# Patient Record
Sex: Female | Born: 1970 | Race: White | Hispanic: No | Smoking: Former smoker
Health system: Southern US, Community
[De-identification: ages and names within clinical notes are randomized; demographics above are authoritative.]

## PROBLEM LIST (undated history)

## (undated) DIAGNOSIS — F909 Attention-deficit hyperactivity disorder, unspecified type: Secondary | ICD-10-CM

---

## 2003-10-29 ENCOUNTER — Ambulatory Visit (HOSPITAL_COMMUNITY): Admission: RE | Admit: 2003-10-29 | Discharge: 2003-10-29 | Payer: Self-pay | Admitting: Obstetrics and Gynecology

## 2004-04-14 ENCOUNTER — Inpatient Hospital Stay (HOSPITAL_COMMUNITY): Admission: AD | Admit: 2004-04-14 | Discharge: 2004-04-16 | Payer: Self-pay | Admitting: Obstetrics and Gynecology

## 2004-05-25 ENCOUNTER — Other Ambulatory Visit: Admission: RE | Admit: 2004-05-25 | Discharge: 2004-05-25 | Payer: Self-pay | Admitting: Obstetrics and Gynecology

## 2005-06-11 ENCOUNTER — Other Ambulatory Visit: Admission: RE | Admit: 2005-06-11 | Discharge: 2005-06-11 | Payer: Self-pay | Admitting: Obstetrics and Gynecology

## 2008-12-26 ENCOUNTER — Emergency Department: Payer: Self-pay | Admitting: Internal Medicine

## 2009-12-27 IMAGING — CT CT HEAD WITHOUT CONTRAST
2 series · 16 of 30 positions shown, 20 images · non-contrast
Comparison: none

REASON FOR EXAM: loc contusion
COMMENTS:

PROCEDURE:     CT  - CT HEAD WITHOUT CONTRAST  - December 26, 2008  [DATE]
RESULT:     Comparison: None
TECHNIQUE: Multiple axial images from the foramen magnum to the vertex were
obtained without IV contrast.

[Series 2: without · axial · non-contrast · 0.42mm/px · z∈[+344,+474]mm · 13 of 32 slices shown, 17 images]
[im 3/32  brain]
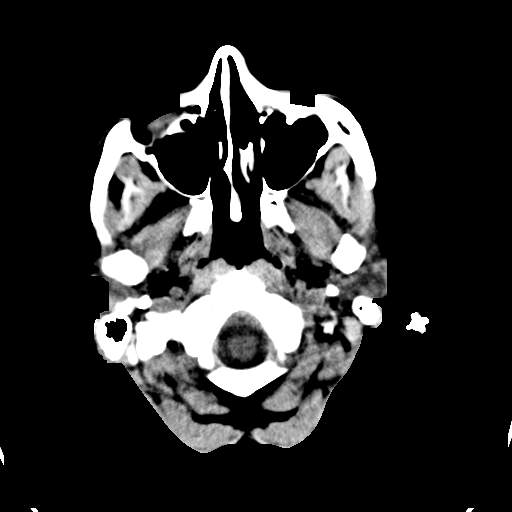
[im 3/32  bone]
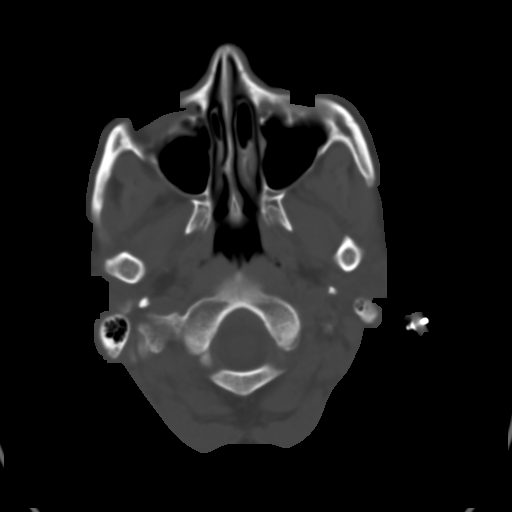
[im 5/32  brain]
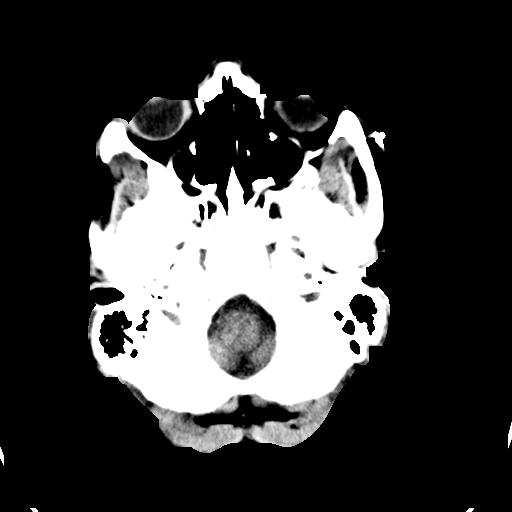
[im 7/32  brain]
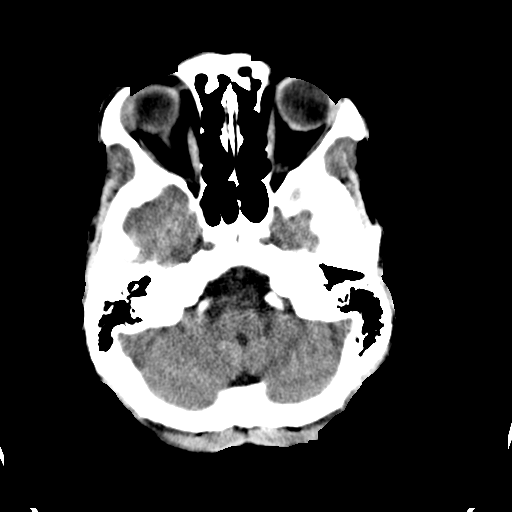
[im 9/32  brain]
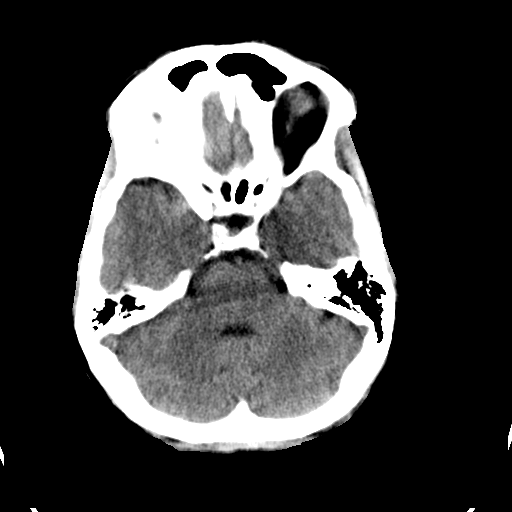
[im 12/32  brain]
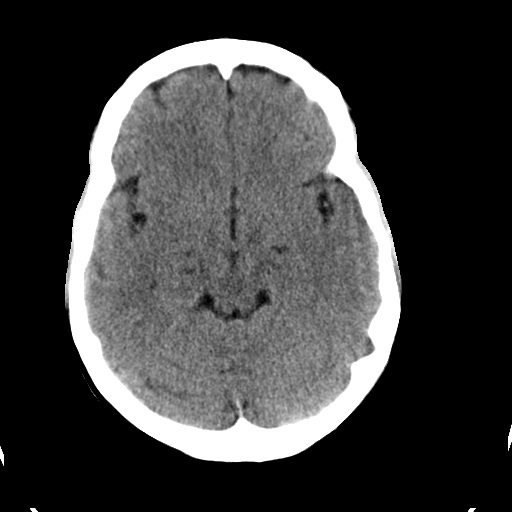
[im 12/32  bone]
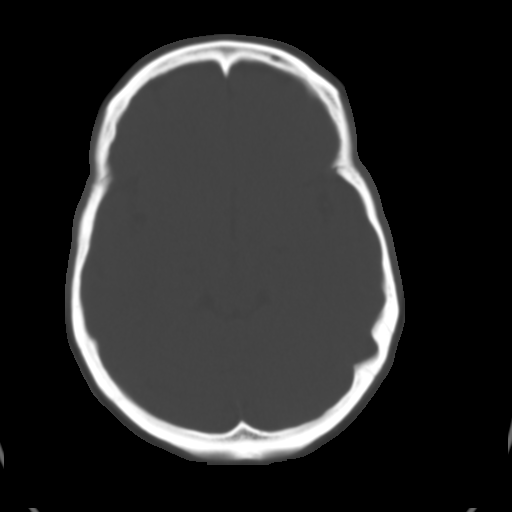
[im 14/32  brain]
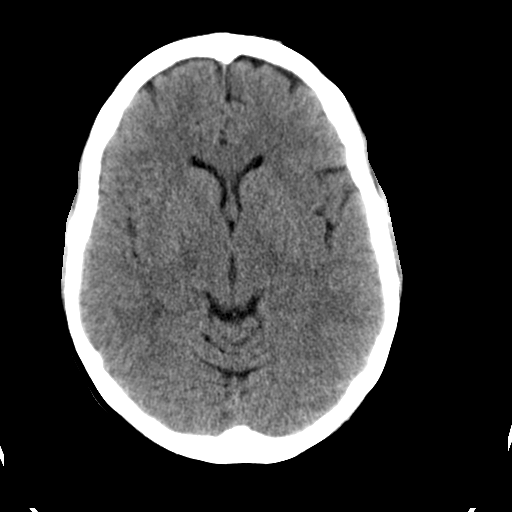
[im 16/32  brain]
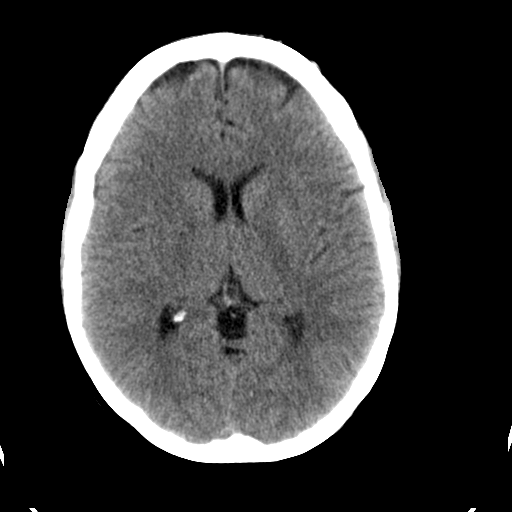
[im 18/32  brain]
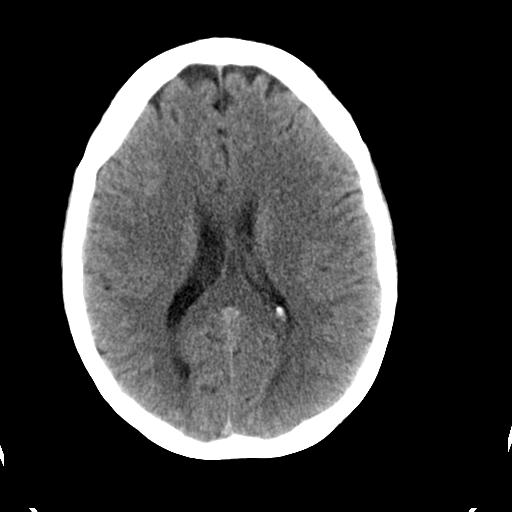
[im 20/32  brain]
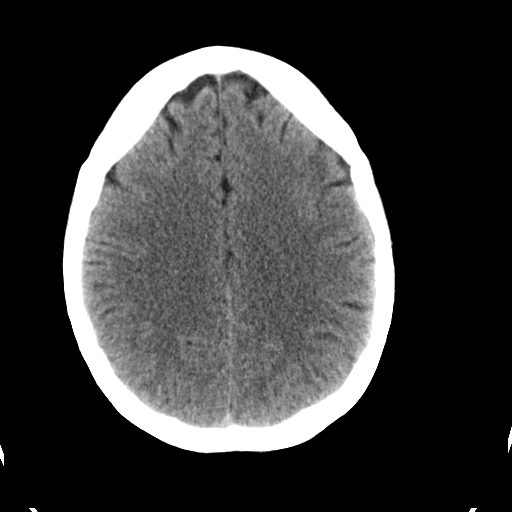
[im 20/32  bone]
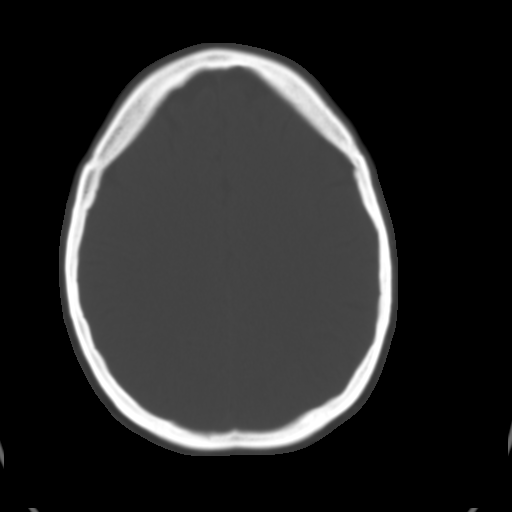
[im 23/32  brain]
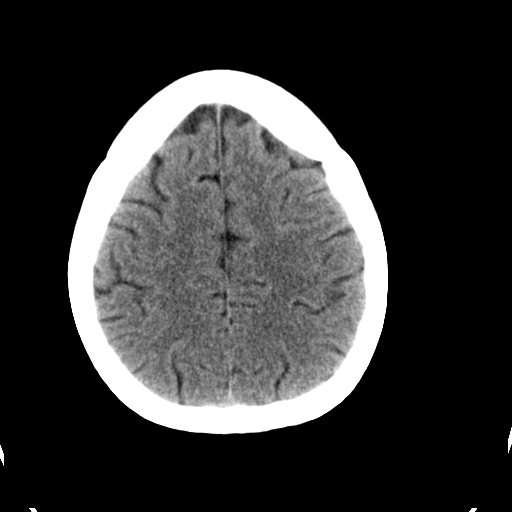
[im 25/32  brain]
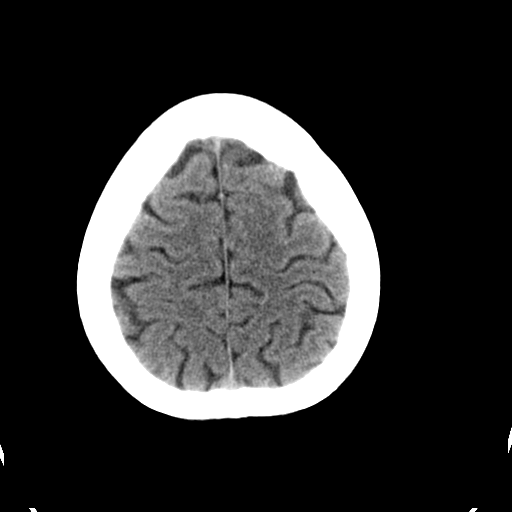
[im 27/32  brain]
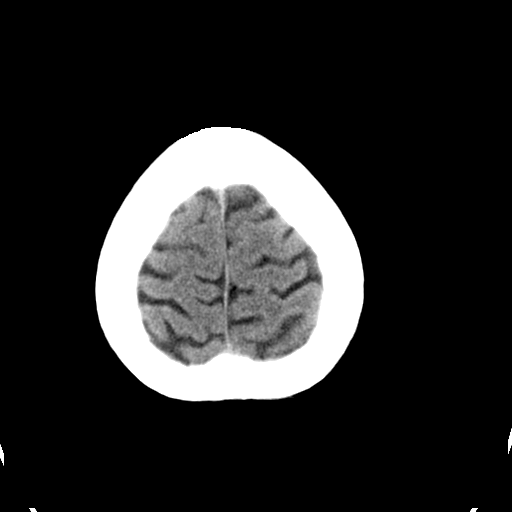
[im 29/32  brain]
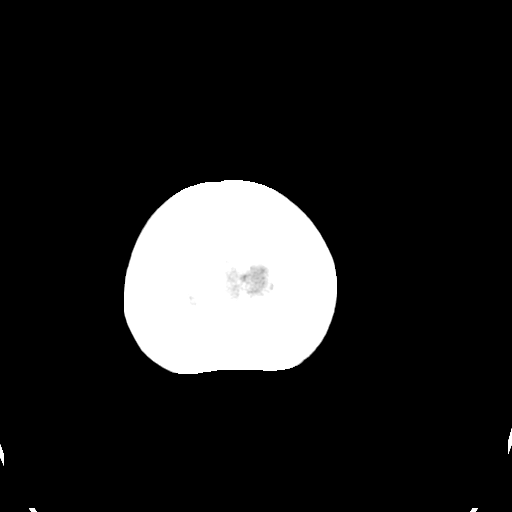
[im 29/32  bone]
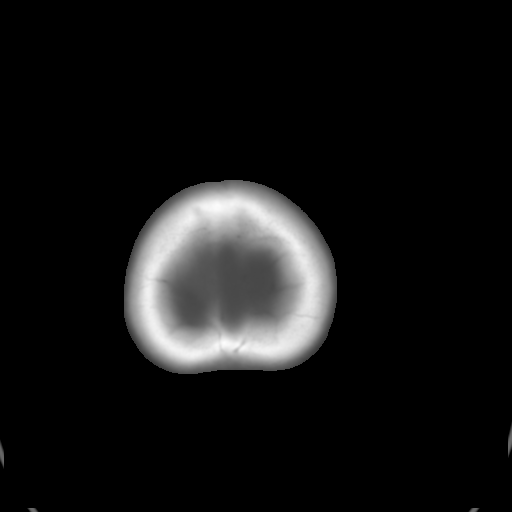

[Series 3: bone · axial · 0.42mm/px · z∈[+344,+388]mm · 3 of 32 slices shown]
[im 3/32  bone]
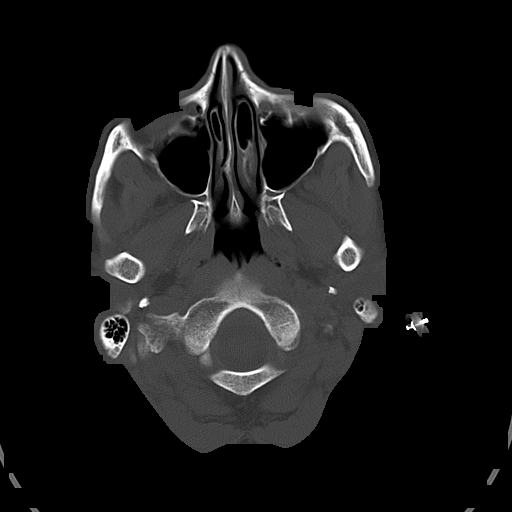
[im 7/32  bone]
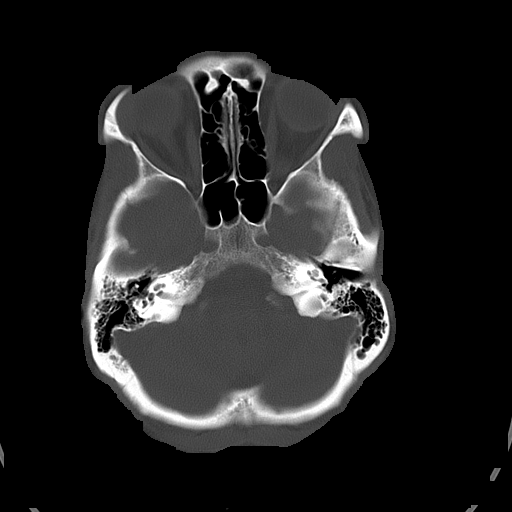
[im 12/32  bone]
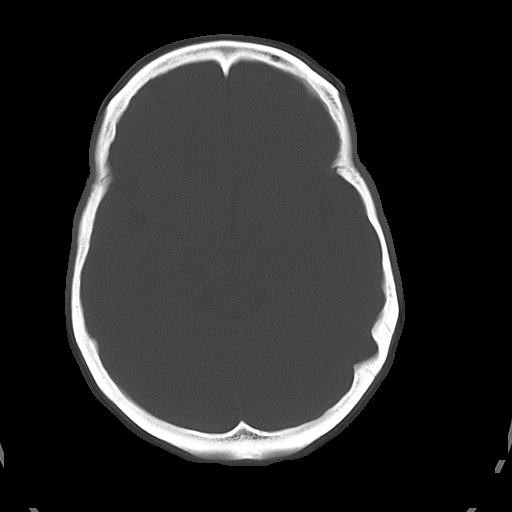

[16 of 30 positions shown; findings below may reference images not displayed]

FINDINGS: There is no evidence for mass effect, midline shift, or extra-axial fluid
collections.  There is no evidence for space-occupying lesion or
intracranial hemorrhage. There is no evidence for a cortical-based area of
acute infarction.

The ventricles and sulci are appropriate for the patient's age. The basal
cisterns are patent.

Visualized portions of the orbits are unremarkable. The paranasal sinuses
and mastoid air cells are unremarkable.

The osseous structures are unremarkable.
IMPRESSION: No acute intracranial process.

## 2010-04-10 ENCOUNTER — Ambulatory Visit: Payer: Self-pay | Admitting: Gastroenterology

## 2010-07-19 ENCOUNTER — Emergency Department: Payer: Self-pay | Admitting: Emergency Medicine

## 2010-07-26 ENCOUNTER — Emergency Department: Payer: Self-pay | Admitting: Emergency Medicine

## 2011-07-20 IMAGING — US TRANSABDOMINAL ULTRASOUND OF PELVIS
1 series · 17 of 25 positions shown · non-contrast
Comparison: none

REASON FOR EXAM: lower abd pain
COMMENTS:   LMP: Three weeks ago

PROCEDURE:     US  - US PELVIS EXAM  - July 19, 2010  [DATE]
RESULT:     Comparison: None
INDICATION: Lower abdominal pain.
TECHNIQUE: Multiple transabdominal gray-scale images  of the pelvis
performed. Patient refused endovaginal examination.

[Series 1: transabdominal ultrasound of pelvis · 17 of 35 slices shown]
[im 1/35]
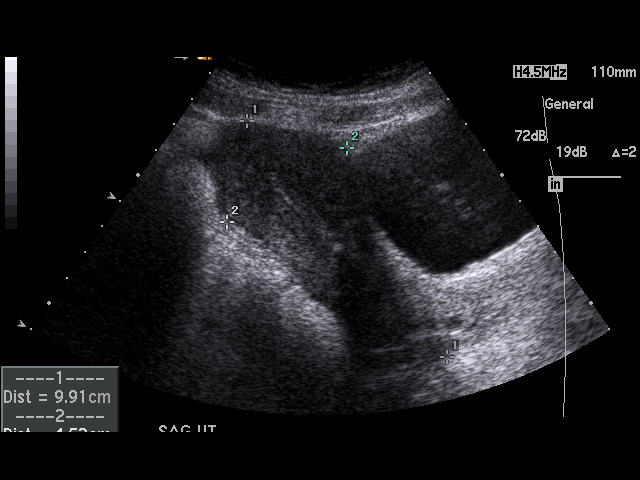
[im 3/35]
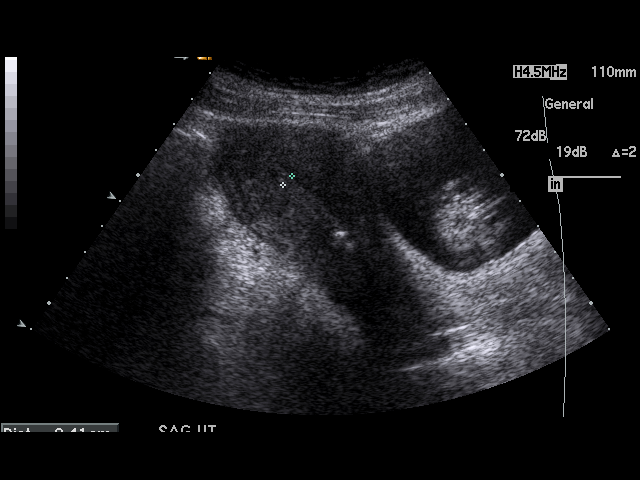
[im 5/35]
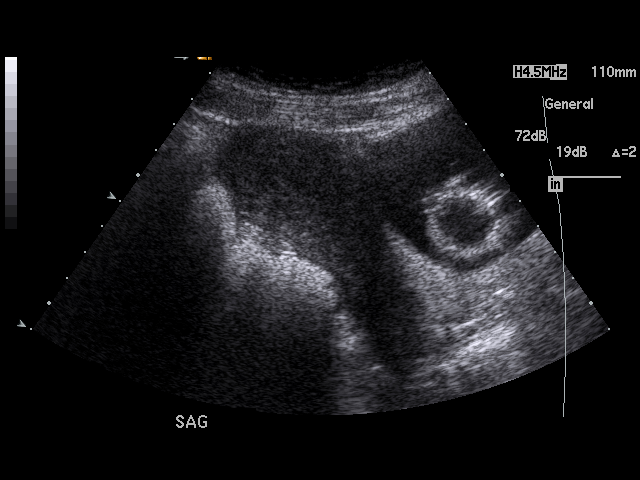
[im 8/35]
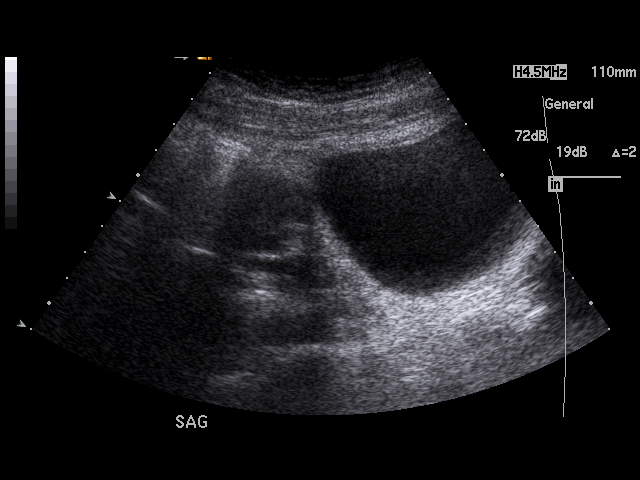
[im 9/35]
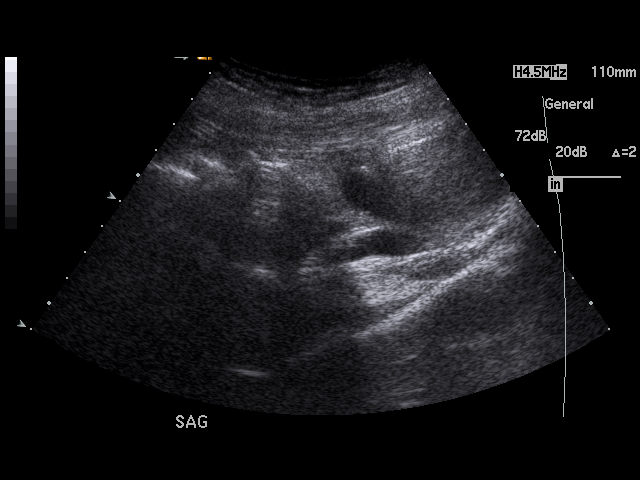
[im 12/35]
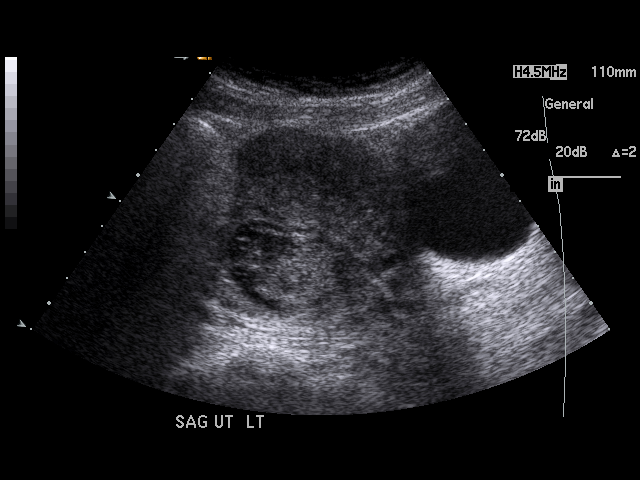
[im 13/35]
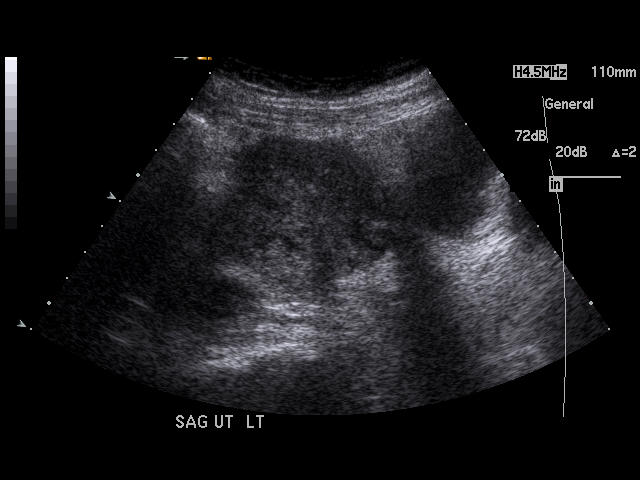
[im 16/35]
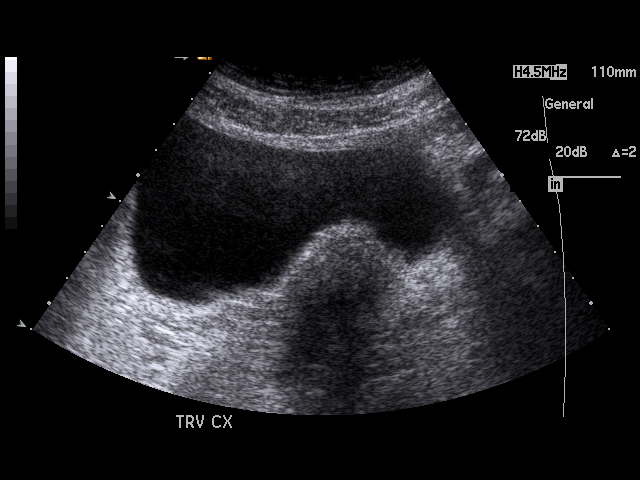
[im 18/35]
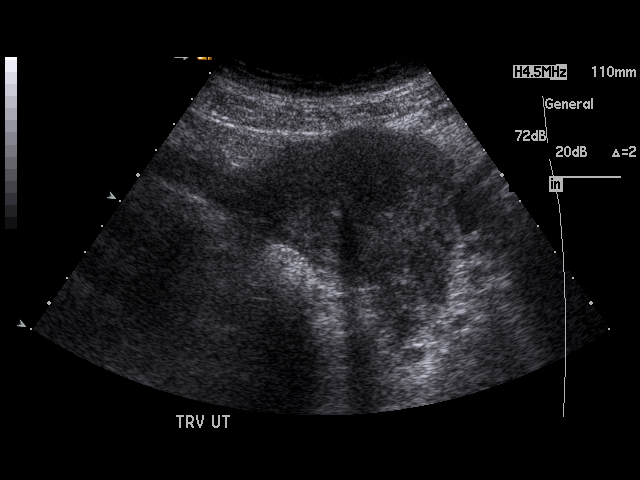
[im 19/35]
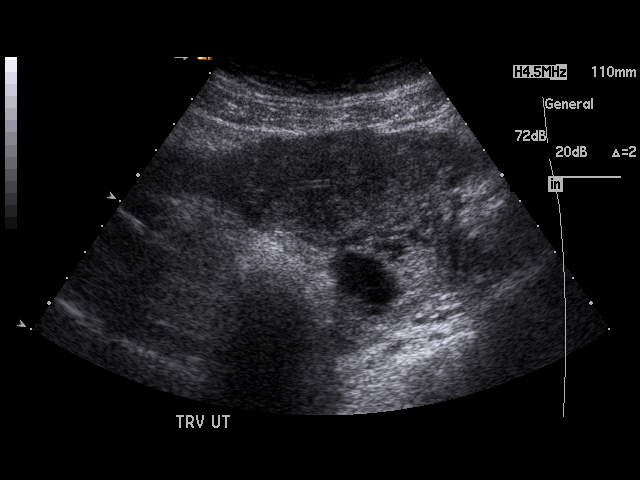
[im 22/35]
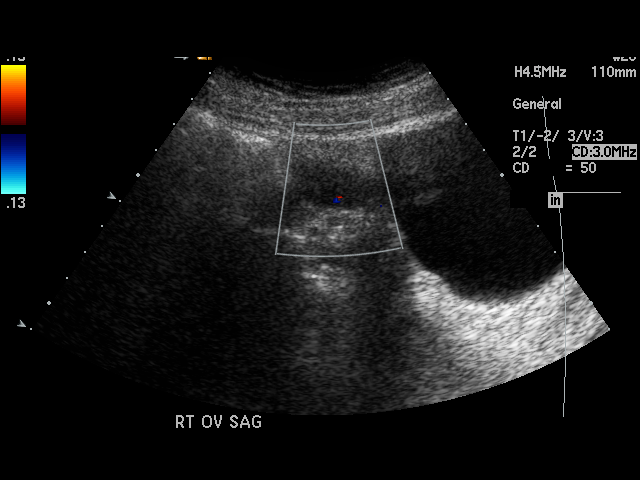
[im 23/35]
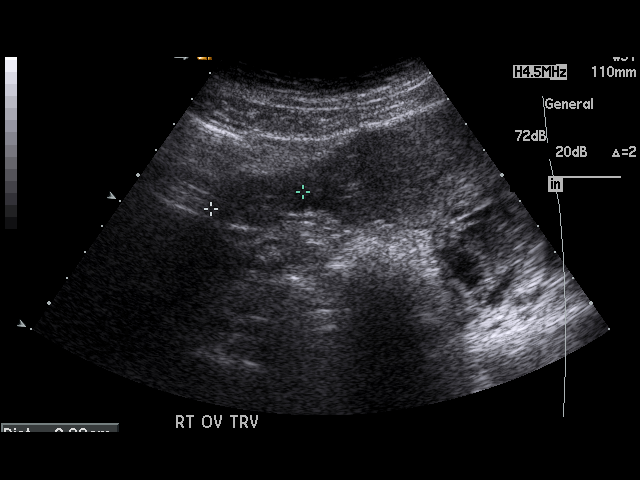
[im 26/35]
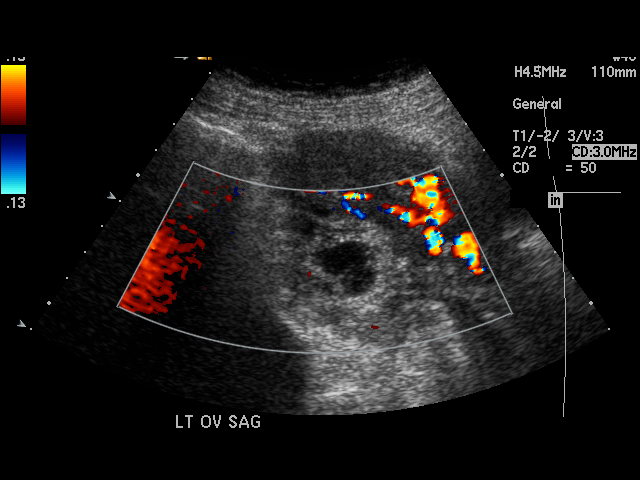
[im 27/35]
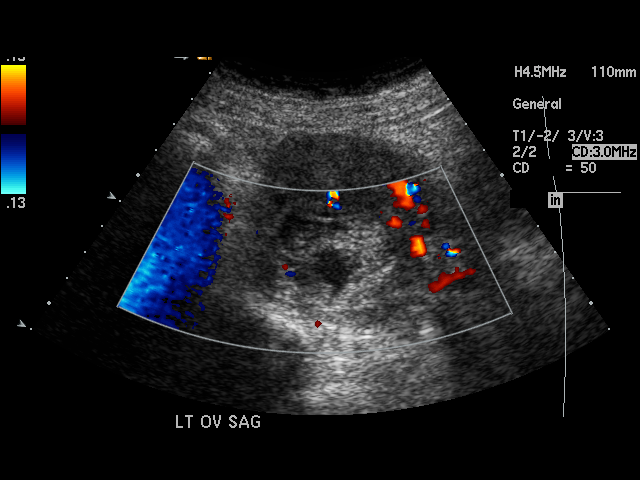
[im 30/35]
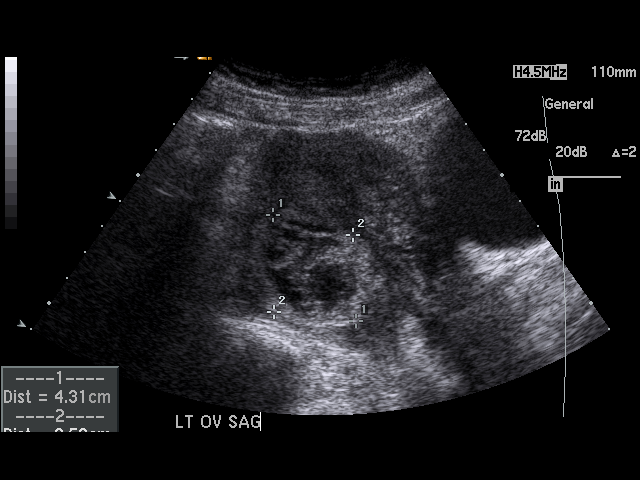
[im 32/35]
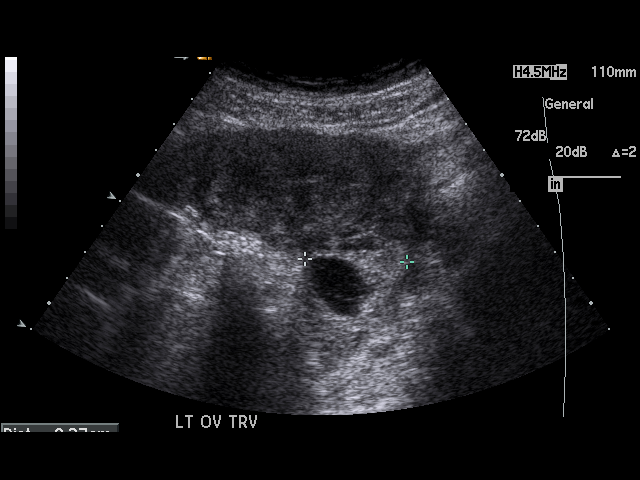
[im 35/35]
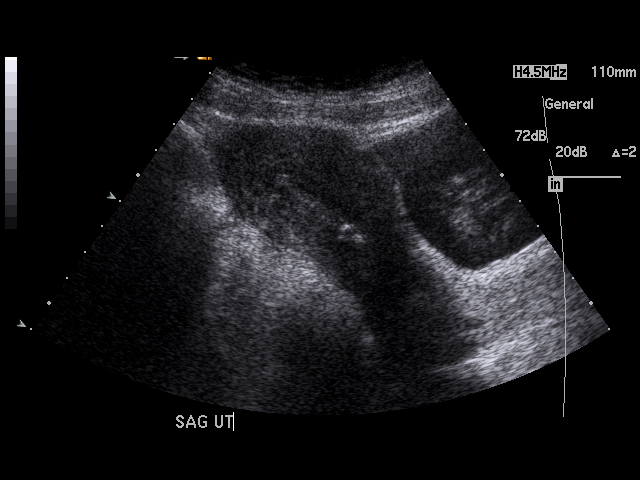

[17 of 25 positions shown; findings below may reference images not displayed]

FINDINGS: The uterus is normal in echotexture measuring 9.9 x 4.5 x 6.1 cm , with
transabdominal ultrasound. The endometrial stripe is uniform and homogeneous
measuring 4.1 mm.  There is an intrauterine device in place. There are no
abnormal solid or cystic myometrial mass lesions noted.

The right ovary measures 3.4 x 1.8 x 3 cm.  The left ovary measures 5.5 x
4.4 x 3.9 cm.  There is a 4.3 x 3.5 x 3.3 cm complex left ovarian mass with
multiple internal fixed septations and small cystic areas.

There is no pelvic free fluid.
IMPRESSION: There is a 4.3 x 3.5 x 3.3 cm complex left ovarian mass with multiple
internal fixed septations and small cystic areas.  This may represent a
hemorrhagic cyst versus endometrioma versus less likely malignancy.
Recommend followup pelvic ultrasound in 4-6 weeks.

## 2011-07-20 IMAGING — US ABDOMEN ULTRASOUND
1 series · 17 of 25 positions shown · non-contrast
Comparison: none

REASON FOR EXAM: abd pain for 3 days.
COMMENTS:   May transport without cardiac monitor

PROCEDURE:     US  - US ABDOMEN GENERAL SURVEY  - July 19, 2010  [DATE]
RESULT:     Comparison: None
TECHNIQUE: Multiple gray-scale and color-flow Doppler images of the abdomen
are presented for review.

[Series 1: abdomen ultrasound · 17 of 55 slices shown]
[im 1/55]
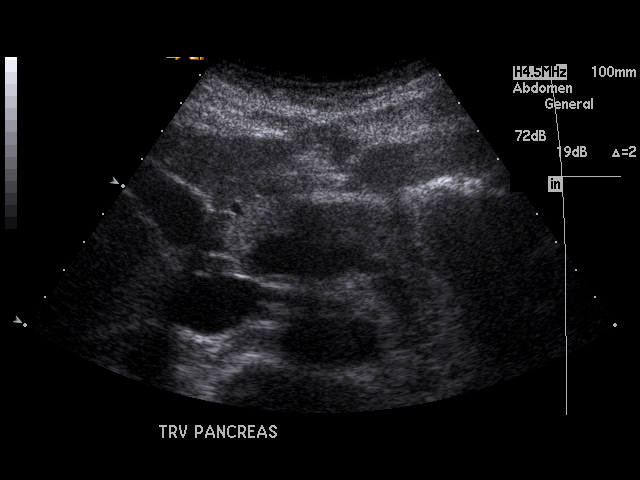
[im 5/55]
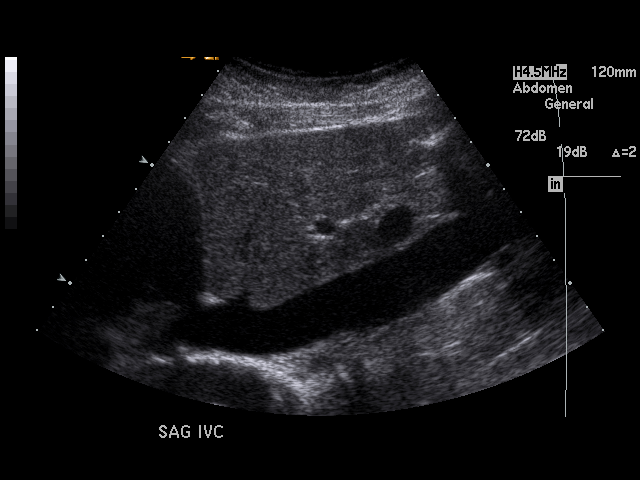
[im 7/55]
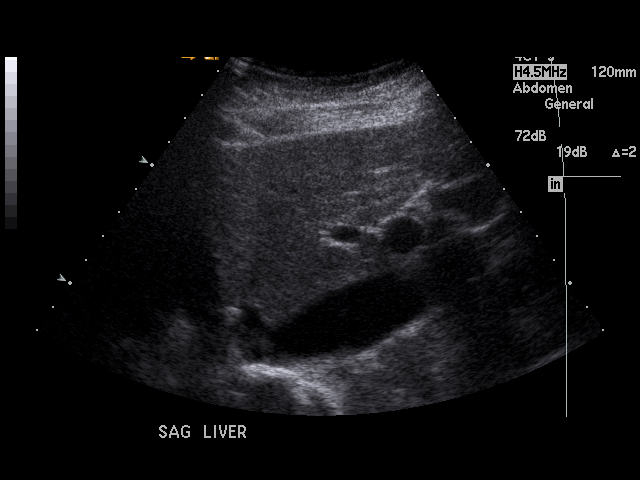
[im 12/55]
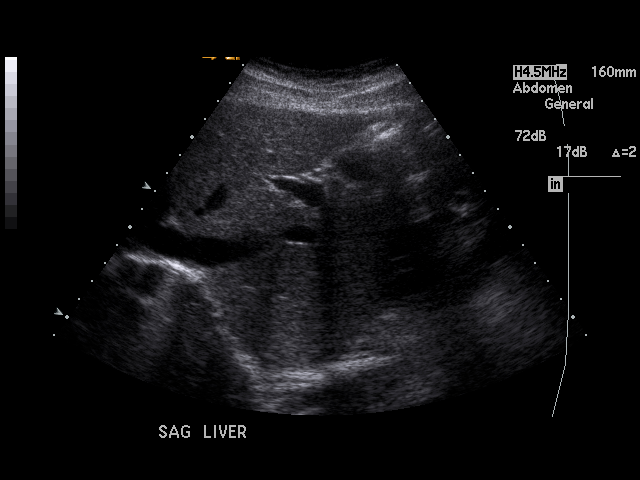
[im 14/55]
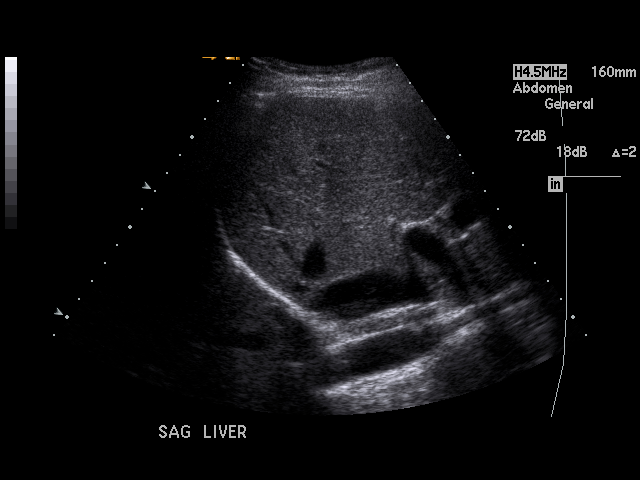
[im 19/55]
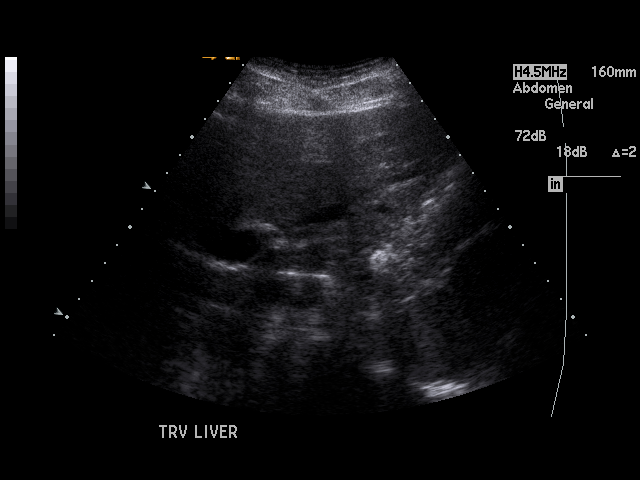
[im 21/55]
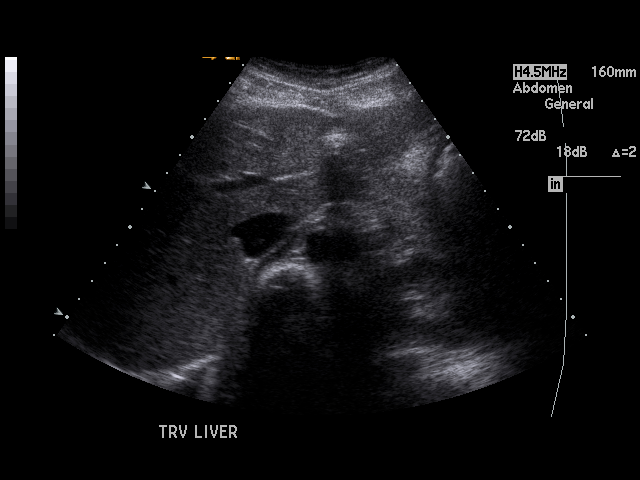
[im 25/55]
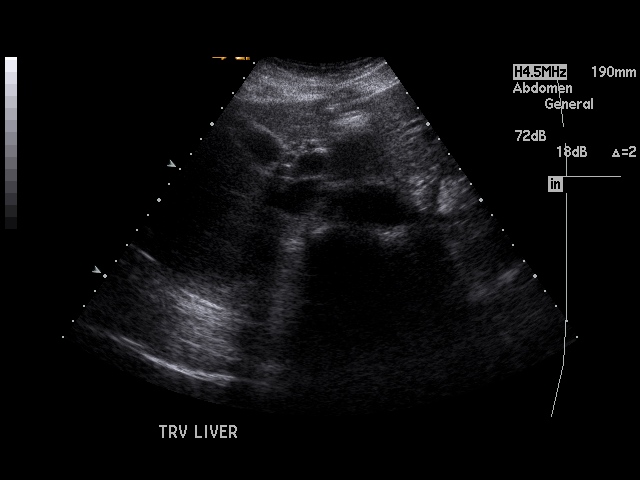
[im 28/55]
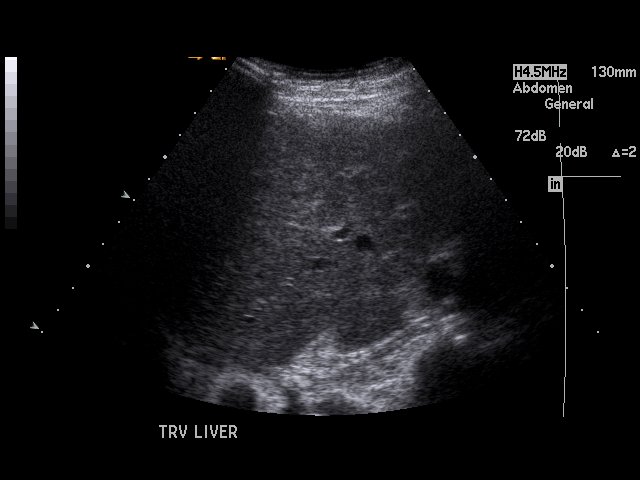
[im 30/55]
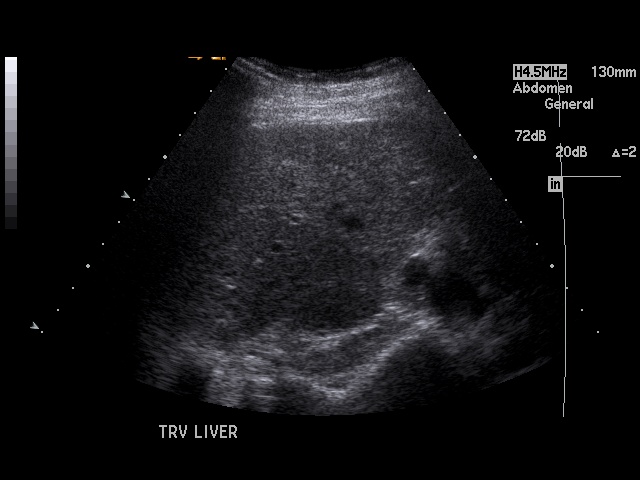
[im 34/55]
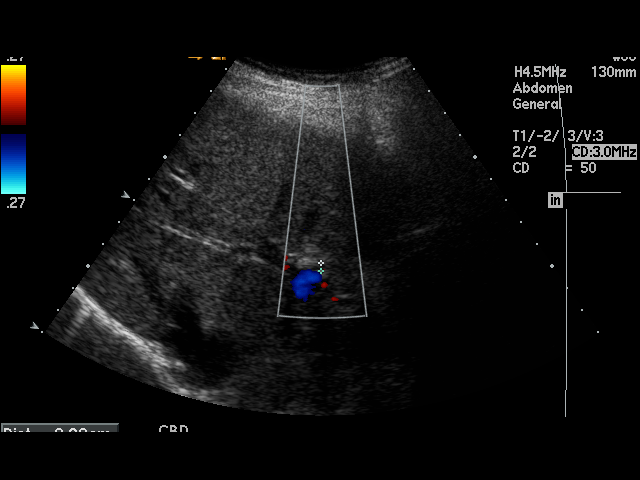
[im 37/55]
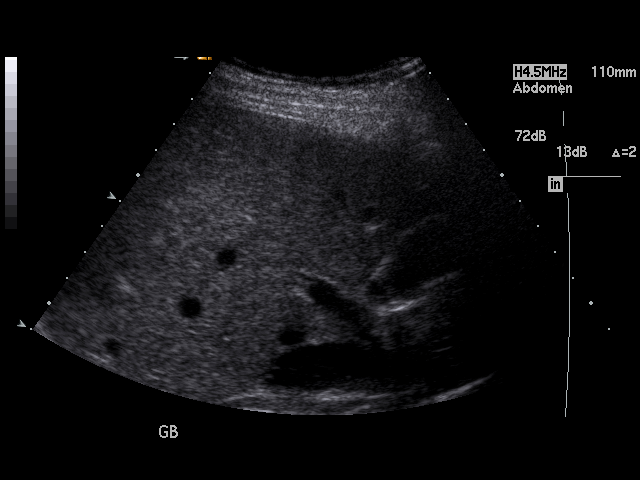
[im 41/55]
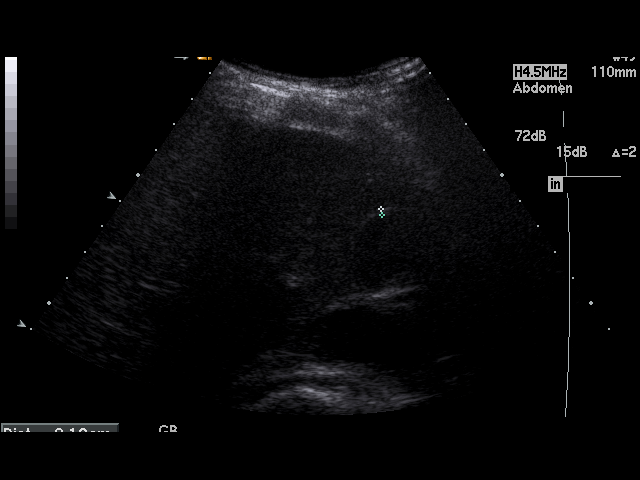
[im 43/55]
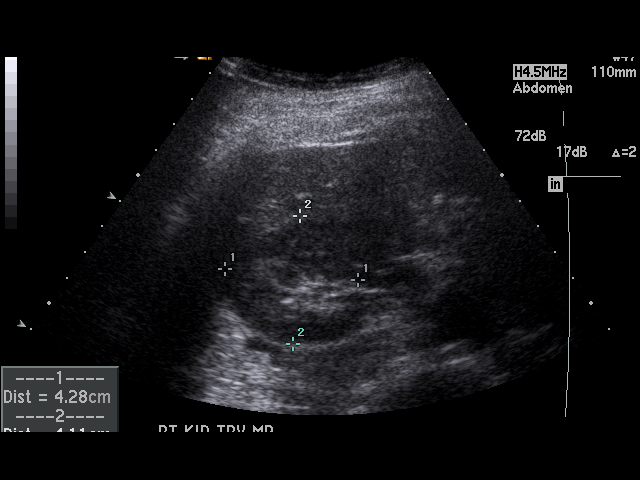
[im 48/55]
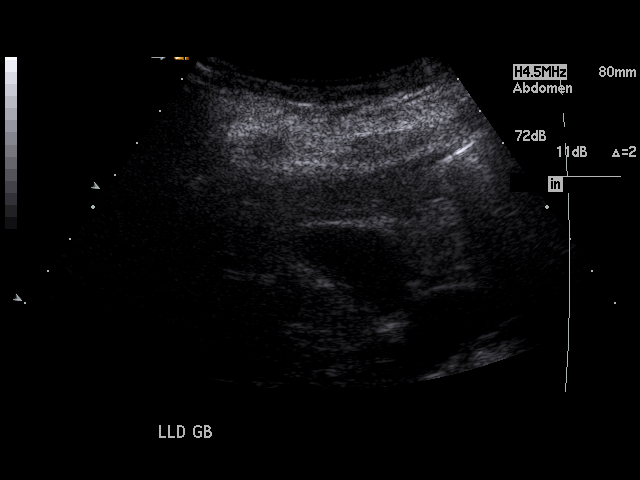
[im 50/55]
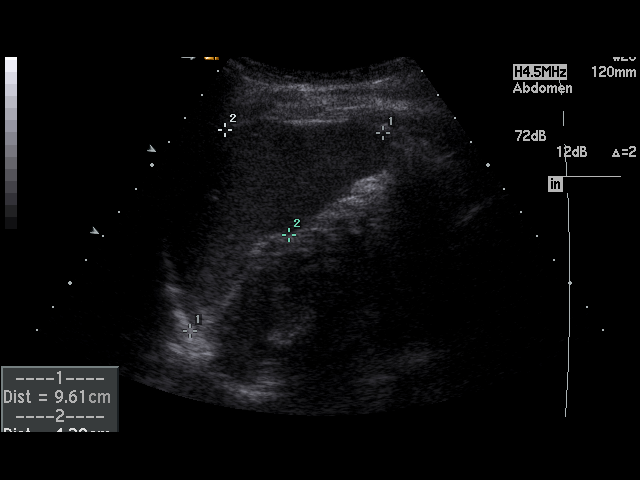
[im 55/55]
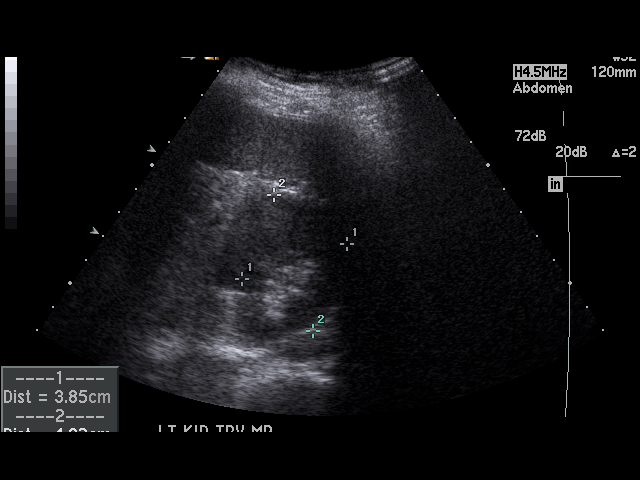

[17 of 25 positions shown; findings below may reference images not displayed]

FINDINGS: Visualized portions of the liver demonstrate normal echogenicity and normal
contours. The liver is without evidence of a focal hepatic lesion.

There is no cholelithiasis or biliary sludge. There is no intra- or
extrahepatic biliary ductal dilatation. The common duct measures 3 mm in
maximal diameter. There is no gallbladder wall thickening, pericholecystic
fluid, or sonographic Murphy's sign.

The visualized portion of the pancreas is normal in echogenicity. The spleen
is unremarkable. Bilateral kidneys are normal in echogenicity and size. The
right kidney measures 10.4 x 4.1 x 4.3 cm. The left kidney measures 10.5 x
4.1 x 3.9 cm. There are no renal calculi or hydronephrosis. The abdominal
aorta and IVC are unremarkable.
IMPRESSION: Normal abdominal ultrasound.

## 2011-11-28 ENCOUNTER — Other Ambulatory Visit: Payer: Self-pay | Admitting: Obstetrics and Gynecology

## 2011-11-28 DIAGNOSIS — R928 Other abnormal and inconclusive findings on diagnostic imaging of breast: Secondary | ICD-10-CM

## 2011-12-03 ENCOUNTER — Ambulatory Visit
Admission: RE | Admit: 2011-12-03 | Discharge: 2011-12-03 | Disposition: A | Discharge status: Home / Self Care | Payer: Self-pay | Source: Ambulatory Visit | Attending: Obstetrics and Gynecology | Admitting: Obstetrics and Gynecology

## 2011-12-03 DIAGNOSIS — R928 Other abnormal and inconclusive findings on diagnostic imaging of breast: Secondary | ICD-10-CM

## 2019-11-13 ENCOUNTER — Ambulatory Visit: Payer: Self-pay | Attending: Internal Medicine

## 2019-11-13 DIAGNOSIS — Z23 Encounter for immunization: Secondary | ICD-10-CM

## 2019-11-13 NOTE — Progress Notes (Signed)
   Covid-19 Vaccination Clinic  Name:  Judy Hays    MRN: [id number] DOB: 1971/01/08  11/13/2019  [name] was observed post Covid-19 immunization for 15 minutes without incident. She was provided with Vaccine Information Sheet and instruction to access the V-Safe system.   [name] was instructed to call 911 with any severe reactions post vaccine: Marland Kitchen Difficulty breathing  . Swelling of face and throat  . A fast heartbeat  . A bad rash all over body  . Dizziness and weakness   Immunizations Administered    Name Date Dose VIS Date Route   Pfizer COVID-19 Vaccine 11/13/2019  9:12 AM 0.3 mL 08/07/2019 Intramuscular   Manufacturer: [employer], [employer]   Lot: [id number]   NDC: [address]-3

## 2019-12-09 ENCOUNTER — Ambulatory Visit: Payer: Self-pay | Attending: Internal Medicine

## 2019-12-09 DIAGNOSIS — Z23 Encounter for immunization: Secondary | ICD-10-CM

## 2019-12-09 NOTE — Progress Notes (Signed)
   Covid-19 Vaccination Clinic  Name:  Judy Hays    MRN: [id number] DOB: 1970-11-19  12/09/2019  Judy Hays was observed post Covid-19 immunization for 15 minutes without incident. She was provided with Vaccine Information Sheet and instruction to access the V-Safe system.   Judy Hays was instructed to call 911 with any severe reactions post vaccine: Marland Kitchen Difficulty breathing  . Swelling of face and throat  . A fast heartbeat  . A bad rash all over body  . Dizziness and weakness   Immunizations Administered    Name Date Dose VIS Date Route   Pfizer COVID-19 Vaccine 12/09/2019  9:13 AM 0.3 mL 08/07/2019 Intramuscular   Manufacturer: [employer], [employer]   Lot: [id number]   NDC: [address]-5

## 2022-07-13 ENCOUNTER — Other Ambulatory Visit (HOSPITAL_BASED_OUTPATIENT_CLINIC_OR_DEPARTMENT_OTHER): Payer: Self-pay

## 2022-07-13 MED ORDER — LISDEXAMFETAMINE DIMESYLATE 60 MG PO CAPS
60.0000 mg | ORAL_CAPSULE | Freq: Every day | ORAL | 0 refills | Status: AC
  Filled 2022-07-13: qty 30, 30d supply, fill #0

## 2022-07-14 ENCOUNTER — Other Ambulatory Visit (HOSPITAL_BASED_OUTPATIENT_CLINIC_OR_DEPARTMENT_OTHER): Payer: Self-pay

## 2023-05-24 ENCOUNTER — Ambulatory Visit
Admission: RE | Admit: 2023-05-24 | Discharge: 2023-05-24 | Disposition: A | Discharge status: Home / Self Care | Payer: 59 | Source: Ambulatory Visit

## 2023-05-24 VITALS — BP 118/82 | HR 89 | Temp 99.0°F | Resp 18

## 2023-05-24 DIAGNOSIS — M5442 Lumbago with sciatica, left side: Secondary | ICD-10-CM | POA: Diagnosis not present

## 2023-05-24 DIAGNOSIS — M5441 Lumbago with sciatica, right side: Secondary | ICD-10-CM | POA: Diagnosis not present

## 2023-05-24 HISTORY — DX: Attention-deficit hyperactivity disorder, unspecified type: F90.9

## 2023-05-24 MED ORDER — PREDNISONE 10 MG (21) PO TBPK: ORAL_TABLET | Freq: Every day | ORAL | 0 refills | Status: AC

## 2023-05-24 MED ORDER — CYCLOBENZAPRINE HCL 10 MG PO TABS: 10.0000 mg | ORAL_TABLET | Freq: Two times a day (BID) | ORAL | 0 refills | Status: AC | PRN

## 2023-05-24 MED ORDER — KETOROLAC TROMETHAMINE 30 MG/ML IJ SOLN
30.0000 mg | Freq: Once | INTRAMUSCULAR | Status: AC
  Administered 2023-05-24: 30 mg via INTRAMUSCULAR

## 2023-05-24 NOTE — Discharge Instructions (Addendum)
Your pain is most likely caused by irritation to the muscles .  You have been given an injection of Toradol which helps to reduce inflammation in turn will help with your pain, daily you see improvement in 30 minutes to an hour  Starting tomorrow take prednisone as directed with food to continue the above process, may use Tylenol in addition to this as needed as well as any topical products  May use muscle relaxant twice daily for additional comfort, be mindful this may make you feel sleepy  You may use heating pad in 15 minute intervals as needed for additional comfort,or you may find comfort in using ice in 10-15 minutes over affected area  Begin stretching affected area daily for 10 minutes as tolerated to further loosen muscles   When sitting and lying down place pillow underneath and between knees for support  Can try sleeping without pillow on firm mattress   Practice good posture: head back, shoulders back, chest forward, pelvis back and weight distributed evenly on both legs  If pain persist after recommended treatment or reoccurs if may be beneficial to follow up with orthopedic specialist for evaluation, this doctor specializes in the bones and can manage your symptoms long-term with options such as but not limited to imaging, medications or physical therapy

## 2023-05-24 NOTE — ED Provider Notes (Signed)
Judy Hays    CSN: [id number] Arrival date & time: 05/24/23  0865      History   Chief Complaint Chief Complaint  Patient presents with   Back Pain    HPI Judy Hays is a 52 y.o. female.   Patient presents for evaluation of of constant right-sided low back pain present for 1 month.  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  Pain radiates into the bilateral buttocks wrapping into the hips with associated numbness and tingling.  Symptoms exacerbated when sitting and when changing positions.  Improves with standing.  Has attempted use of an inversion table, Advil and Vicodin which has provided minimal relief.  Denies injury or trauma.  Past Medical History:  Diagnosis Date   ADHD     There are no problems to display for this patient.    OB History   No obstetric history on file.      Home Medications    Prior to Admission medications   Medication Sig Start Date End Date Taking? Authorizing Provider  cyclobenzaprine (FLEXERIL) 10 MG tablet Take 1 tablet (10 mg total) by mouth 2 (two) times daily as needed for muscle spasms. 05/24/23  Yes Lenisha Lacap R, NP  diazepam (VALIUM) 10 MG tablet Take 5-10 mg by mouth daily as needed. 04/12/23  Yes [provider]  Glycopyrronium Tosylate (QBREXZA) 2.4 % PADS daily as needed. 03/20/19  Yes [provider]  HYDROcodone-Acetaminophen 5-300 MG TABS Take 1-2 tablets by mouth every 4 (four) hours as needed. 09/25/17  Yes [provider]  levonorgestrel (MIRENA) 20 MCG/DAY IUD 1 each by Intrauterine route once.   Yes [provider]  predniSONE (STERAPRED UNI-PAK 21 TAB) 10 MG (21) TBPK tablet Take by mouth daily. Take 6 tabs by mouth daily  for 1 days, then 5 tabs for 1 days, then 4 tabs for 1 days, then 3 tabs for 1 days, 2 tabs for 1 days, then 1 tab by mouth daily for 1 days 05/24/23  Yes Tiane Szydlowski R, NP  lisdexamfetamine (VYVANSE)  60 MG capsule Take 1 capsule (60 mg total) by mouth daily. 07/13/22     valACYclovir (VALTREX) 1000 MG tablet Take 1,000 mg by mouth daily.    [provider]    Family History No family history on file.  Social History Social History   Tobacco Use   Smoking status: Former    Types: Cigarettes   Smokeless tobacco: Never  Vaping Use   Vaping status: Former  Substance Use Topics   Alcohol use: Not Currently   Drug use: Yes    Types: Marijuana     Allergies   Wound dressing adhesive   Review of Systems Review of Systems   Physical Exam Triage Vital Signs ED Triage Vitals  Encounter Vitals Group     BP 05/24/23 0958 118/82     Systolic BP Percentile --      Diastolic BP Percentile --      Pulse Rate 05/24/23 0958 89     Resp 05/24/23 0958 18     Temp 05/24/23 0958 99 F (37.2 C)     Temp Source 05/24/23 0958 Oral     SpO2 05/24/23 0958 97 %     Weight --      Height --      Head Circumference --      Peak Flow --      Pain  Score 05/24/23 0954 7     Pain Loc --      Pain Education --      Exclude from Growth Chart --    No data found.  Updated Vital Signs BP 118/82 (BP Location: Left Arm)   Pulse 89   Temp 99 F (37.2 C) (Oral)   Resp 18   SpO2 97%   Visual Acuity Right Eye Distance:   Left Eye Distance:   Bilateral Distance:    Right Eye Near:   Left Eye Near:    Bilateral Near:     Physical Exam Constitutional:      Appearance: Normal appearance.  Eyes:     Extraocular Movements: Extraocular movements intact.  Pulmonary:     Effort: Pulmonary effort is normal.  Musculoskeletal:     Comments: Unable to reproduce tenderness, no ecchymosis swelling or deformity, pain elicited with twisting turning and bending, unable to assess straight leg test as patient cannot tolerate sitting  Neurological:     Mental Status: She is alert and oriented to person, place, and time. Mental status is at baseline.      UC Treatments / Results   Labs (all labs ordered are listed, but only abnormal results are displayed) Labs Reviewed - No data to display  EKG   Radiology No results found.  Procedures Procedures (including critical care time)  Medications Ordered in UC Medications  ketorolac (TORADOL) 30 MG/ML injection 30 mg (has no administration in time range)    Initial Impression / Assessment and Plan / UC Course  I have reviewed the triage vital signs and the nursing notes.  Pertinent labs & imaging results that were available during my care of the patient were reviewed by me and considered in my medical decision making (see chart for details).  Acute right-sided low back pain with bilateral sciatica  Etiology most likely muscular, due to lack of injury deferring imaging at this time, no prior history of back pain, Toradol injection given in office and prescribed prednisone and Flexeril for outpatient use, recommended RICE, heat, massage, stretching and activity as tolerated, given walking referral to orthopedics if symptoms continue to persist or worsen Final Clinical Impressions(s) / UC Diagnoses   Final diagnoses:  Acute right-sided low back pain with bilateral sciatica     Discharge Instructions      Your pain is most likely caused by irritation to the muscles .  You have been given an injection of Toradol which helps to reduce inflammation in turn will help with your pain, daily you see improvement in 30 minutes to an hour  Starting tomorrow take prednisone as directed with food to continue the above process, may use Tylenol in addition to this as needed as well as any topical products  May use muscle relaxant twice daily for additional comfort, be mindful this may make you feel sleepy  You may use heating pad in 15 minute intervals as needed for additional comfort,or you may find comfort in using ice in 10-15 minutes over affected area  Begin stretching affected area daily for 10 minutes as  tolerated to further loosen muscles   When sitting and lying down place pillow underneath and between knees for support  Can try sleeping without pillow on firm mattress   Practice good posture: head back, shoulders back, chest forward, pelvis back and weight distributed evenly on both legs  If pain persist after recommended treatment or reoccurs if may be beneficial to follow up with orthopedic  specialist for evaluation, this doctor specializes in the bones and can manage your symptoms long-term with options such as but not limited to imaging, medications or physical therapy      ED Prescriptions     Medication Sig Dispense Auth. Provider   predniSONE (STERAPRED UNI-PAK 21 TAB) 10 MG (21) TBPK tablet Take by mouth daily. Take 6 tabs by mouth daily  for 1 days, then 5 tabs for 1 days, then 4 tabs for 1 days, then 3 tabs for 1 days, 2 tabs for 1 days, then 1 tab by mouth daily for 1 days 21 tablet Duane Trias R, NP   cyclobenzaprine (FLEXERIL) 10 MG tablet Take 1 tablet (10 mg total) by mouth 2 (two) times daily as needed for muscle spasms. 20 tablet Valinda Hoar, NP      PDMP not reviewed this encounter.   Valinda Hoar, NP 05/24/23 1020

## 2023-05-24 NOTE — ED Triage Notes (Signed)
Pt presents with lower back pain x 1 month.  The pain is to the point where it hurts to sit.

## 2024-05-20 ENCOUNTER — Other Ambulatory Visit: Payer: Self-pay | Admitting: Internal Medicine

## 2024-05-20 DIAGNOSIS — Z0000 Encounter for general adult medical examination without abnormal findings: Secondary | ICD-10-CM

## 2024-05-26 ENCOUNTER — Ambulatory Visit
Admission: RE | Admit: 2024-05-26 | Discharge: 2024-05-26 | Disposition: A | Discharge status: Home / Self Care | Source: Ambulatory Visit | Attending: Internal Medicine | Admitting: Internal Medicine

## 2024-05-26 DIAGNOSIS — Z0000 Encounter for general adult medical examination without abnormal findings: Secondary | ICD-10-CM

## 2024-08-14 ENCOUNTER — Other Ambulatory Visit: Payer: Self-pay | Admitting: Medical Genetics

## 2024-09-22 ENCOUNTER — Other Ambulatory Visit: Payer: Self-pay | Admitting: Medical Genetics

## 2024-09-22 DIAGNOSIS — Z006 Encounter for examination for normal comparison and control in clinical research program: Secondary | ICD-10-CM

## 2024-10-01 ENCOUNTER — Other Ambulatory Visit
